# Patient Record
Sex: Male | Born: 1990 | Race: Black or African American | Hispanic: No | Marital: Single
Health system: Southern US, Community
[De-identification: ages and names within clinical notes are randomized; demographics above are authoritative.]

---

## 2019-09-16 ENCOUNTER — Emergency Department (HOSPITAL_COMMUNITY): Payer: Self-pay

## 2019-09-16 ENCOUNTER — Emergency Department (HOSPITAL_COMMUNITY)
Admission: EM | Admit: 2019-09-16 | Discharge: 2019-09-16 | Disposition: A | Discharge status: Home / Self Care | Payer: Self-pay | Attending: Emergency Medicine | Admitting: Emergency Medicine

## 2019-09-16 DIAGNOSIS — Y92410 Unspecified street and highway as the place of occurrence of the external cause: Secondary | ICD-10-CM | POA: Insufficient documentation

## 2019-09-16 DIAGNOSIS — M25552 Pain in left hip: Secondary | ICD-10-CM | POA: Insufficient documentation

## 2019-09-16 DIAGNOSIS — M545 Low back pain: Secondary | ICD-10-CM | POA: Insufficient documentation

## 2019-09-16 DIAGNOSIS — M542 Cervicalgia: Secondary | ICD-10-CM | POA: Insufficient documentation

## 2019-09-16 DIAGNOSIS — Y939 Activity, unspecified: Secondary | ICD-10-CM | POA: Insufficient documentation

## 2019-09-16 DIAGNOSIS — R079 Chest pain, unspecified: Secondary | ICD-10-CM | POA: Insufficient documentation

## 2019-09-16 DIAGNOSIS — Y999 Unspecified external cause status: Secondary | ICD-10-CM | POA: Insufficient documentation

## 2019-09-16 MED ORDER — LORAZEPAM 2 MG/ML IJ SOLN
1.0000 mg | Freq: Once | INTRAMUSCULAR | Status: AC
  Administered 2019-09-16: 1 mg via INTRAVENOUS
  Filled 2019-09-16: qty 1

## 2019-09-16 MED ORDER — IBUPROFEN 800 MG PO TABS
800.0000 mg | ORAL_TABLET | Freq: Three times a day (TID) | ORAL | 0 refills | Status: DC | PRN
Start: 2019-09-16 — End: 2019-09-16

## 2019-09-16 MED ORDER — CYCLOBENZAPRINE HCL 10 MG PO TABS: 10.0000 mg | ORAL_TABLET | Freq: Two times a day (BID) | ORAL | 0 refills | Status: AC | PRN

## 2019-09-16 MED ORDER — CYCLOBENZAPRINE HCL 10 MG PO TABS
10.0000 mg | ORAL_TABLET | Freq: Two times a day (BID) | ORAL | 0 refills | Status: DC | PRN
Start: 2019-09-16 — End: 2019-09-16

## 2019-09-16 MED ORDER — KETOROLAC TROMETHAMINE 30 MG/ML IJ SOLN
30.0000 mg | Freq: Once | INTRAMUSCULAR | Status: AC
  Administered 2019-09-16: 30 mg via INTRAVENOUS
  Filled 2019-09-16: qty 1

## 2019-09-16 MED ORDER — IBUPROFEN 800 MG PO TABS
800.0000 mg | ORAL_TABLET | Freq: Three times a day (TID) | ORAL | 0 refills | Status: AC | PRN
Start: 2019-09-16 — End: ?

## 2019-09-16 NOTE — Discharge Instructions (Addendum)
Please read the attachments on shelters and local social services here in [place], [place].  You were given a prescription for Flexeril which is a muscle relaxer.  You should not drive, work, consume alcohol, or operate machinery while taking this medication as it can make you very drowsy.   Please return to the ED or seek immediate medical attention should you experience any new or symptoms.
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
## 2019-09-16 NOTE — ED Notes (Signed)
ED Provider at bedside. 
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
